# Patient Record
Sex: Male | Born: 1980 | Race: Black or African American | Hispanic: No | Marital: Single | Smoking: Never smoker
Health system: Southern US, Community
[De-identification: ages and names within clinical notes are randomized; demographics above are authoritative.]

## PROBLEM LIST (undated history)

## (undated) DIAGNOSIS — M549 Dorsalgia, unspecified: Secondary | ICD-10-CM

---

## 2013-02-14 DEATH — deceased

## 2017-04-20 ENCOUNTER — Encounter (HOSPITAL_COMMUNITY): Payer: Self-pay

## 2017-04-20 ENCOUNTER — Emergency Department (HOSPITAL_COMMUNITY): Payer: Self-pay

## 2017-04-20 ENCOUNTER — Emergency Department (HOSPITAL_COMMUNITY)
Admission: EM | Admit: 2017-04-20 | Discharge: 2017-04-20 | Disposition: A | Discharge status: Home / Self Care | Payer: Self-pay | Attending: Emergency Medicine | Admitting: Emergency Medicine

## 2017-04-20 DIAGNOSIS — M545 Low back pain, unspecified: Secondary | ICD-10-CM

## 2017-04-20 HISTORY — DX: Dorsalgia, unspecified: M54.9

## 2017-04-20 MED ORDER — FAMOTIDINE 20 MG PO TABS
20.0000 mg | ORAL_TABLET | Freq: Once | ORAL | Status: AC
  Administered 2017-04-20: 20 mg via ORAL
  Filled 2017-04-20: qty 1

## 2017-04-20 MED ORDER — CYCLOBENZAPRINE HCL 10 MG PO TABS: 10.0000 mg | ORAL_TABLET | Freq: Two times a day (BID) | ORAL | 0 refills | Status: AC | PRN

## 2017-04-20 NOTE — ED Triage Notes (Signed)
Pt reports left lower back and hip pain that has been going on for a couple of months now and its not getting any better. Ambulatory,NAD.

## 2017-04-20 NOTE — ED Notes (Signed)
Declined W/C at D/C and was escorted to lobby by RN. 

## 2017-04-20 NOTE — Discharge Instructions (Signed)
Please read attached information. If you experience any new or worsening signs or symptoms please return to the emergency room for evaluation. Please follow-up with your primary care provider or specialist as discussed. Please use medication prescribed only as directed and discontinue taking if you have any concerning signs or symptoms.   °

## 2017-04-20 NOTE — ED Provider Notes (Signed)
[facility]-EMERGENCY DEPT Provider Note   CSN: [id number] Arrival date & time: 04/20/17  1140  By signing my name below, I, Randy Hoover, attest that this documentation has been prepared under the direction and in the presence of [employer], PA-C. Electronically Signed: Linna Hoover, Scribe. 04/20/2017. 2:25 PM.  History   Chief Complaint Chief Complaint  Patient presents with  . Back Pain   The history is provided by the patient. No language interpreter was used.    HPI Comments: Randy Hoover is a 36 y.o. male who presents to the Emergency Department complaining of gradually worsening left lower back pain for a few months. He states the pain has been intermittently radiating into the left hip for a few weeks. The pain is sharp in nature. His pain is worse upon moving after sitting for a prolonged period of time. No alleviating factors noted and he has not tried any symptomatic pain management at home. Patient works for a [employer] and does a lot of frequent bending and heavy lifting during his shifts. He has not been evaluated for his back pain prior to today. No h/o IV drug use. Patient denies numbness/tingling, focal weakness, fevers, chills, or any other associated symptoms.  Past Medical History:  Diagnosis Date  . Back pain     There are no active problems to display for this patient.   History reviewed. No pertinent surgical history.     Home Medications    Prior to Admission medications   Medication Sig Start Date End Date Taking? Authorizing Provider  cyclobenzaprine (FLEXERIL) 10 MG tablet Take 1 tablet (10 mg total) by mouth 2 (two) times daily as needed for muscle spasms. 04/20/17   Eyvonne Mechanic, PA-C    Family History No family history on file.  Social History Social History  Substance Use Topics  . Smoking status: Never Smoker  . Smokeless tobacco: Never Used  . Alcohol use No     Allergies   Patient has no allergy information on  record.   Review of Systems Review of Systems  Constitutional: Negative for chills and fever.  Musculoskeletal: Positive for back pain and myalgias.  Neurological: Negative for weakness and numbness.  All other systems reviewed and are negative.  Physical Exam Updated Vital Signs BP 135/90 (BP Location: Right Arm)   Pulse 90   Temp 98.2 F (36.8 C) (Oral)   Resp 17   Ht 5\' 11"  (1.803 m)   Wt 88.5 kg (195 lb)   SpO2 95%   BMI 27.20 kg/m   Physical Exam  Constitutional: He is oriented to person, place, and time. He appears well-developed and well-nourished. No distress.  HENT:  Head: Normocephalic and atraumatic.  Eyes: Conjunctivae and EOM are normal.  Neck: Neck supple. No tracheal deviation present.  Cardiovascular: Normal rate.   Pulmonary/Chest: Effort normal. No respiratory distress.  Musculoskeletal: Normal range of motion.  No CT or L-spine tenderness to palpation.  Minor tenderness to left lateral lumbar soft tissue.  Distal sensation strength and motor function intact  Neurological: He is alert and oriented to person, place, and time.  Skin: Skin is warm and dry.  Psychiatric: He has a normal mood and affect. His behavior is normal.  Nursing note and vitals reviewed.  ED Treatments / Results  Labs (all labs ordered are listed, but only abnormal results are displayed) Labs Reviewed - No data to display  EKG  EKG Interpretation None       Radiology Dg Lumbar Spine  Complete  Result Date: 04/20/2017 CLINICAL DATA:  Lower back and left hip pain for the past 5 months. No known injury. EXAM: LUMBAR SPINE - COMPLETE 4+ VIEW COMPARISON:  None in PACs FINDINGS: The lumbar vertebral bodies are preserved in height. The disc space heights are well maintained with exception of mild narrowing at L4-5. There is no spondylolisthesis. There is no significant facet joint hypertrophy. The pedicles and transverse processes are intact. IMPRESSION: There is no acute or  significant chronic bony abnormality of the lumbar spine. There is mild disc space narrowing at L4-5. Given the patient's persistent symptoms with radicular quality on the left, MRI would be a useful next imaging step. Electronically Signed   By: David  [place] M.D.   On: 04/20/2017 15:04   Dg Hip Unilat W Or Wo Pelvis 2-3 Views Left  Result Date: 04/20/2017 CLINICAL DATA:  Back and left hip pain for the past 5 months with no known injury EXAM: DG HIP (WITH OR WITHOUT PELVIS) 2-3V LEFT COMPARISON:  Lumbar spine series of today's date FINDINGS: The bony pelvis is subjectively adequately mineralized. AP and lateral views of the left hip reveal preservation of the joint space. The articular surfaces of the left femoral head and of the acetabulum remains smoothly rounded. The femoral neck, intertrochanteric, and sub trochanteric regions are normal. IMPRESSION: There is no acute or significant chronic bony abnormality of the left hip. Electronically Signed   By: David  [place] M.D.   On: 04/20/2017 15:05    Procedures Procedures (including critical care time)  DIAGNOSTIC STUDIES: Oxygen Saturation is 95% on RA, adequate by my interpretation.    COORDINATION OF CARE: 2:21 PM Discussed treatment plan with pt at bedside and pt agreed to plan.  Medications Ordered in ED Medications  famotidine (PEPCID) tablet 20 mg (20 mg Oral Given 04/20/17 1440)     Initial Impression / Assessment and Plan / ED Course  I have reviewed the triage vital signs and the nursing notes.  Pertinent labs & imaging results that were available during my care of the patient were reviewed by me and considered in my medical decision making (see chart for details).    Patient with uncomplicated back pain.  Likely muscular in nature.  Patient does have some changes on his plain films, he will follow-up with neurosurgery, return precautions given.  Patient verbalized understanding and agreement to today's plan.  Final Clinical  Impressions(s) / ED Diagnoses   Final diagnoses:  Acute left-sided low back pain without sciatica    New Prescriptions Discharge Medication List as of 04/20/2017  3:50 PM    START taking these medications   Details  cyclobenzaprine (FLEXERIL) 10 MG tablet Take 1 tablet (10 mg total) by mouth 2 (two) times daily as needed for muscle spasms., Starting Thu 04/20/2017, Print       I personally performed the services described in this documentation, which was scribed in my presence. The recorded information has been reviewed and is accurate.    Eyvonne MechanicHedges, Yasemin Rabon, PA-C 04/20/17 Rebeca [id number]    Linker, Martha, MD 04/22/17 [phone]

## 2017-12-21 IMAGING — CR DG HIP (WITH OR WITHOUT PELVIS) 2-3V*L*
3 series · 3 of 3 positions shown · non-contrast
Comparison: Lumbar spine series of today's date

CLINICAL DATA: Back and left hip pain for the past 5 months with no
known injury

EXAM:
DG HIP (WITH OR WITHOUT PELVIS) 2-3V LEFT

[pelvis ap]
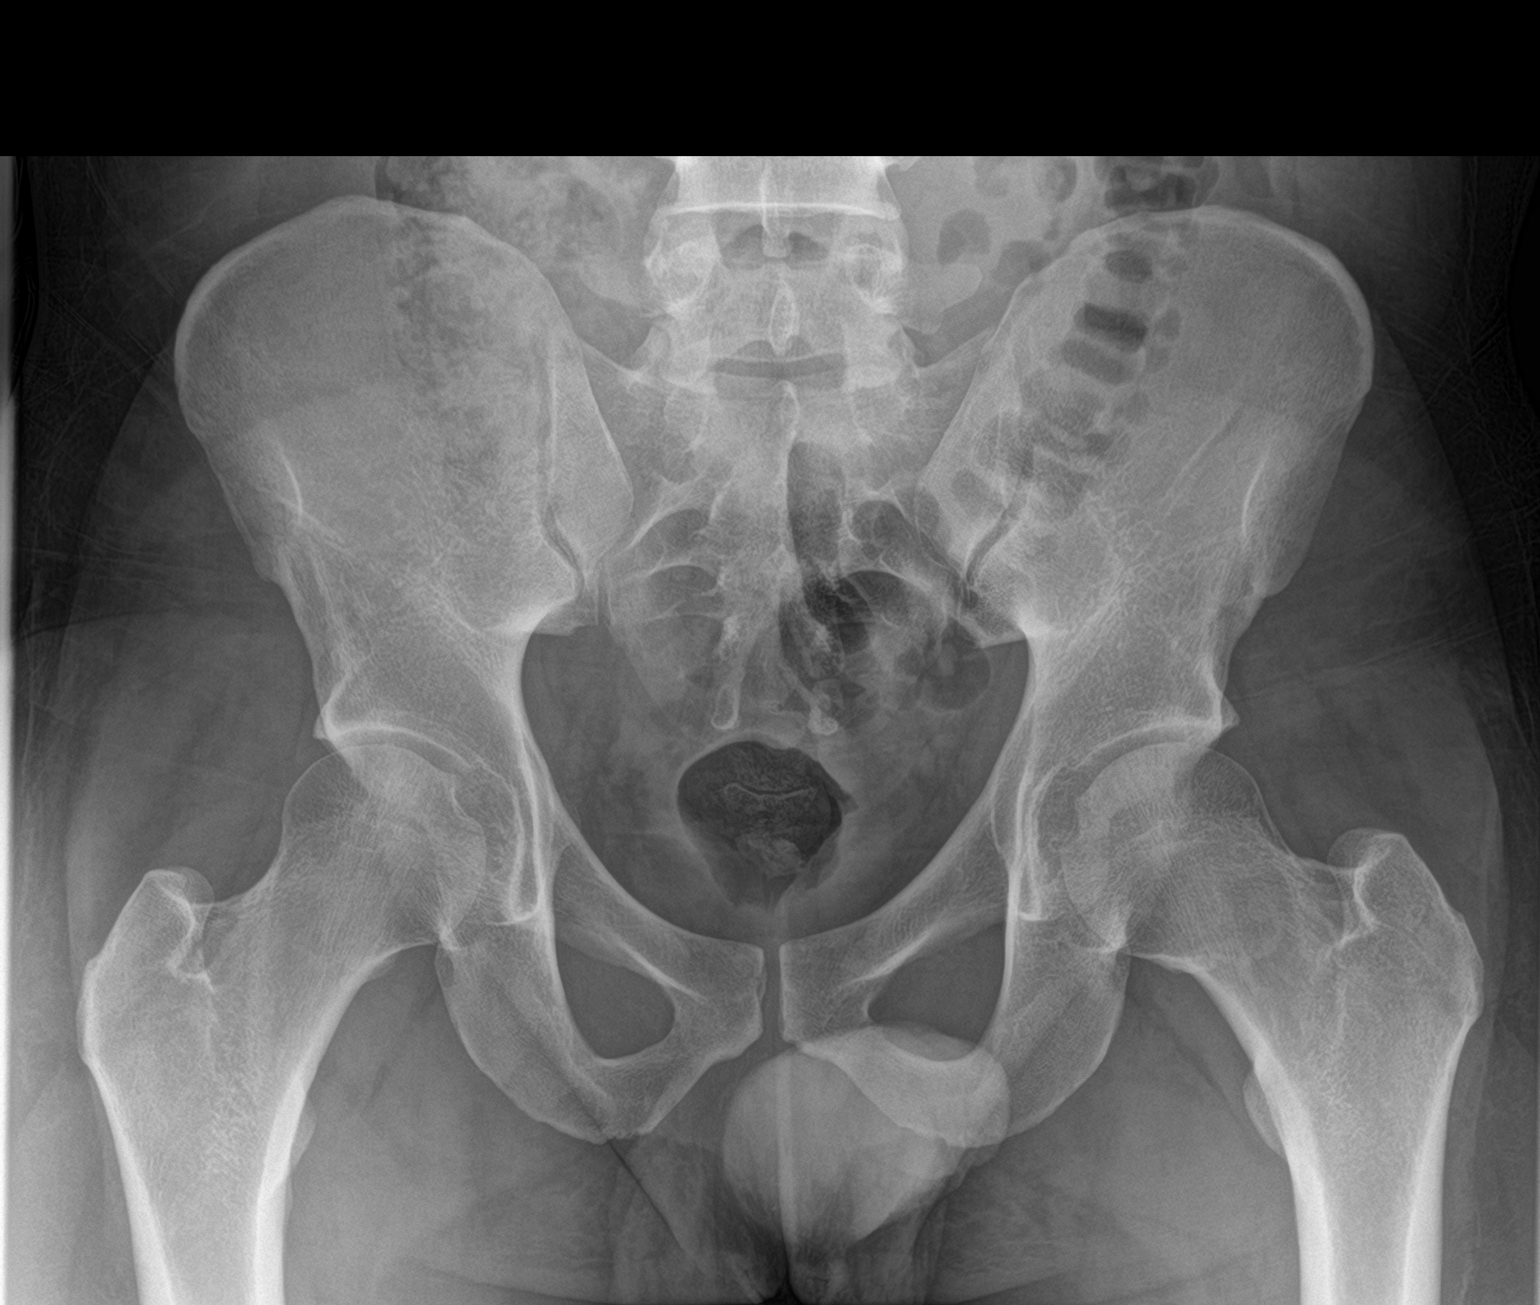

[hip ap]
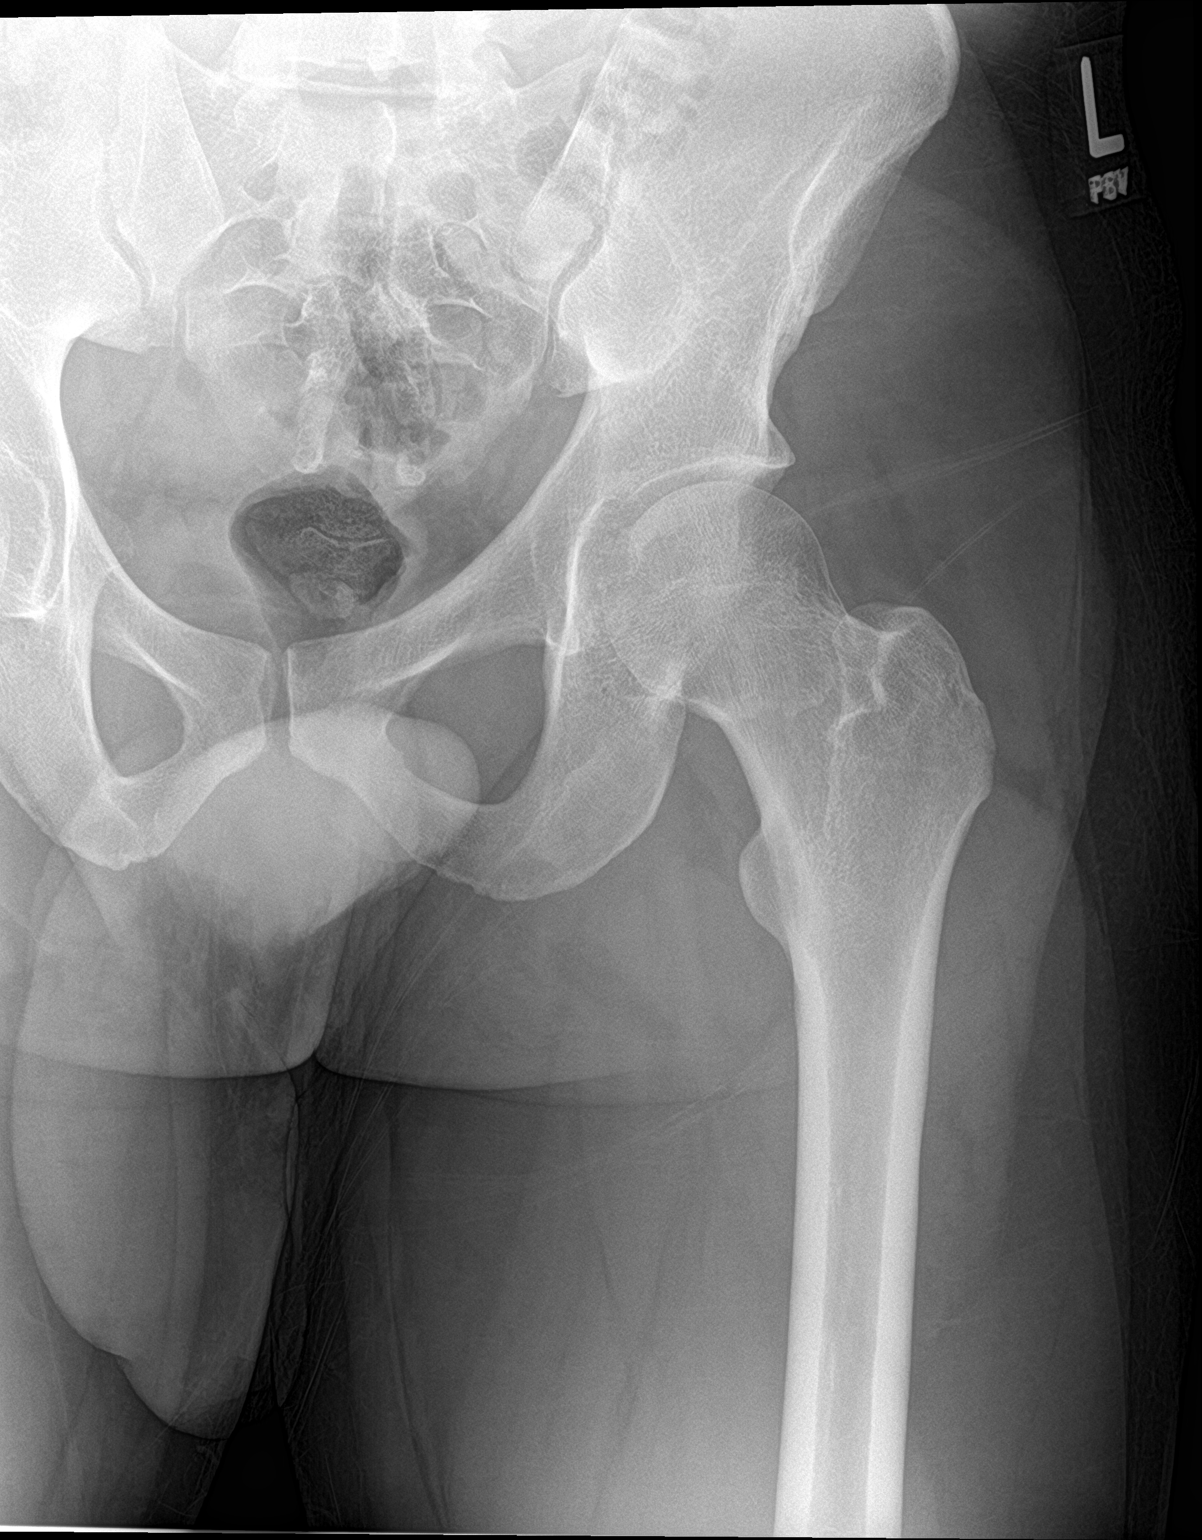

[hip lat]
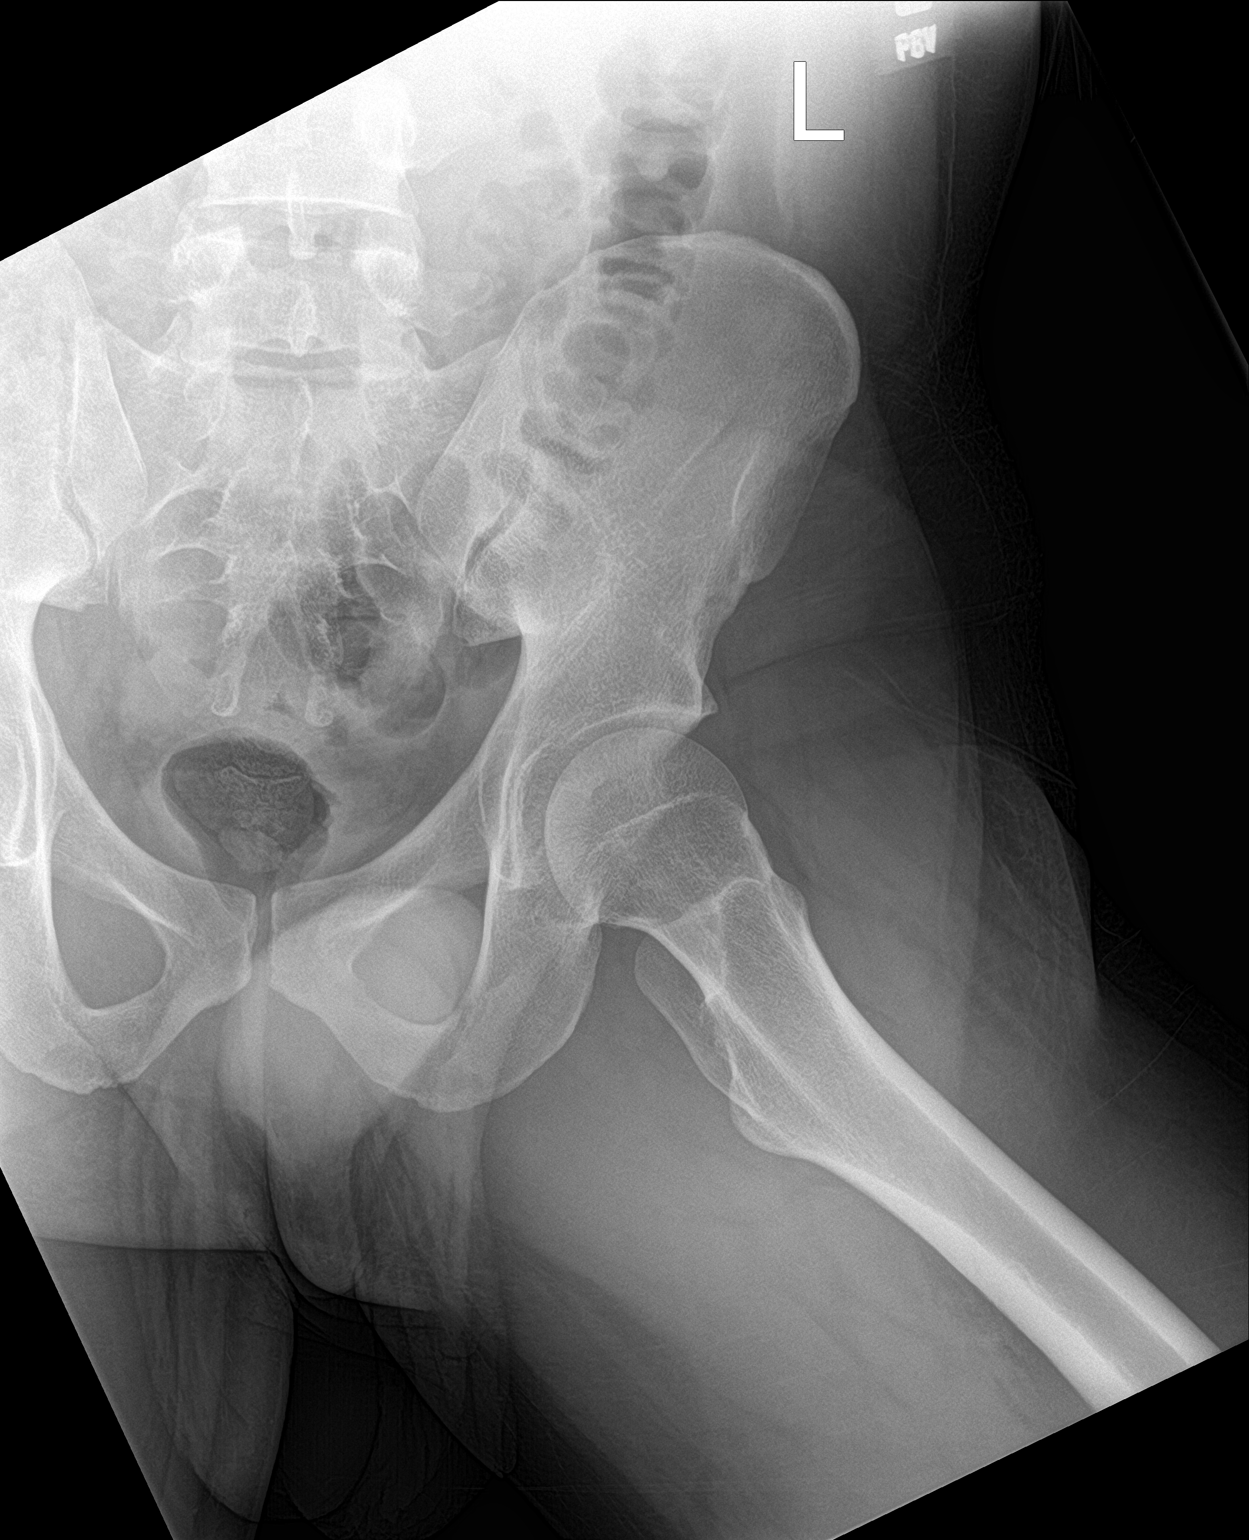

[3 of 3 positions shown; findings below may reference images not displayed]

FINDINGS: The bony pelvis is subjectively adequately mineralized. AP and
lateral views of the left hip reveal preservation of the joint
space. The articular surfaces of the left femoral head and of the
acetabulum remains smoothly rounded. The femoral neck,
intertrochanteric, and sub trochanteric regions are normal.
IMPRESSION: There is no acute or significant chronic bony abnormality of the
left hip.
